# Patient Record
Sex: Male | Born: 1995 | Race: Black or African American | Hispanic: No | Marital: Single | Smoking: Current some day smoker
Health system: Southern US, Community
[De-identification: ages and names within clinical notes are randomized; demographics above are authoritative.]

## PROBLEM LIST (undated history)

## (undated) DIAGNOSIS — Z789 Other specified health status: Secondary | ICD-10-CM

## (undated) HISTORY — PX: OTHER SURGICAL HISTORY: SHX169

---
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
## 2019-06-11 ENCOUNTER — Other Ambulatory Visit: Payer: Self-pay

## 2019-06-11 ENCOUNTER — Ambulatory Visit (HOSPITAL_COMMUNITY)
Admission: EM | Admit: 2019-06-11 | Discharge: 2019-06-11 | Disposition: A | Discharge status: Home / Self Care | Payer: Self-pay | Attending: Internal Medicine | Admitting: Internal Medicine

## 2019-06-11 ENCOUNTER — Other Ambulatory Visit: Payer: Self-pay | Admitting: Internal Medicine

## 2019-06-11 ENCOUNTER — Encounter (HOSPITAL_COMMUNITY): Payer: Self-pay

## 2019-06-11 DIAGNOSIS — Z202 Contact with and (suspected) exposure to infections with a predominantly sexual mode of transmission: Secondary | ICD-10-CM

## 2019-06-11 HISTORY — DX: Other specified health status: Z78.9

## 2019-06-11 MED ORDER — CEFTRIAXONE SODIUM 500 MG IJ SOLR
INTRAMUSCULAR | Status: AC
  Filled 2019-06-11: qty 500

## 2019-06-11 MED ORDER — CEFTRIAXONE SODIUM 500 MG IJ SOLR
500.0000 mg | Freq: Once | INTRAMUSCULAR | Status: AC
  Administered 2019-06-11: 500 mg via INTRAMUSCULAR

## 2019-06-11 NOTE — ED Provider Notes (Signed)
[facility]    CSN: [id number] Arrival date & time: 06/11/19  0900      History   Chief Complaint Chief Complaint  Patient presents with  . SEXUALLY TRANSMITTED DISEASE    HPI Justin Blair is a 24 y.o. male comes to urgent care for STD testing and treatment for gonorrhea.  Patient's sexual partner was found to be positive for gonorrhea.  She is currently pregnant and the diagnosis was made on her routine OB visit.  Patient has no symptoms.  Noted penile discharge, scrotal pain, groin pain or swelling.   HPI  Past Medical History:  Diagnosis Date  . No pertinent past medical history     There are no problems to display for this patient.   Past Surgical History:  Procedure Laterality Date  . no pertinent past surgical history         Home Medications    Prior to Admission medications   Not on File    Family History No family history on file.  Social History Social History   Tobacco Use  . Smoking status: Current Some Day Smoker    Types: Cigars  . Smokeless tobacco: Never Used  . Tobacco comment: 2-3 black and mild 3-4 days per week  Substance Use Topics  . Alcohol use: Not Currently  . Drug use: Yes    Types: Marijuana    Comment: daily     Allergies   Patient has no known allergies.   Review of Systems Review of Systems  Genitourinary: Negative for discharge, dysuria, flank pain, frequency, genital sores, hematuria, penile pain, penile swelling, scrotal swelling and testicular pain.  Neurological: Negative for dizziness, light-headedness and headaches.     Physical Exam Triage Vital Signs ED Triage Vitals  Enc Vitals Group     BP 06/11/19 0918 (!) 145/85     Pulse Rate 06/11/19 0918 73     Resp 06/11/19 0918 16     Temp 06/11/19 0918 98.1 F (36.7 C)     Temp Source 06/11/19 0918 Oral     SpO2 06/11/19 0918 100 %     Weight 06/11/19 0914 121 lb (54.9 kg)     Height --      Head Circumference --      Peak Flow --     Pain Score 06/11/19 0914 0     Pain Loc --      Pain Edu? --      Excl. in GC? --    No data found.  Updated Vital Signs BP (!) 145/85 (BP Location: Left Arm)   Pulse 73   Temp 98.1 F (36.7 C) (Oral)   Resp 16   Wt 54.9 kg   SpO2 100%   Visual Acuity Right Eye Distance:   Left Eye Distance:   Bilateral Distance:    Right Eye Near:   Left Eye Near:    Bilateral Near:     Physical Exam Constitutional:      General: He is not in acute distress.    Appearance: He is not ill-appearing.  Cardiovascular:     Rate and Rhythm: Normal rate and regular rhythm.     Pulses: Normal pulses.     Heart sounds: Normal heart sounds.  Abdominal:     General: Bowel sounds are normal. There is no distension.     Tenderness: There is no abdominal tenderness. There is no rebound.  Genitourinary:    Penis: Normal.  Testes: Normal.  Musculoskeletal:        General: No swelling or tenderness. Normal range of motion.  Neurological:     General: No focal deficit present.     Mental Status: He is alert and oriented to person, place, and time.      UC Treatments / Results  Labs (all labs ordered are listed, but only abnormal results are displayed) Labs Reviewed  CYTOLOGY, (ORAL, ANAL, URETHRAL) ANCILLARY ONLY    EKG   Radiology No results found.  Procedures Procedures (including critical care time)  Medications Ordered in UC Medications  cefTRIAXone (ROCEPHIN) injection 500 mg (500 mg Intramuscular Given 06/11/19 0953)    Initial Impression / Assessment and Plan / UC Course  I have reviewed the triage vital signs and the nursing notes.  Pertinent labs & imaging results that were available during my care of the patient were reviewed by me and considered in my medical decision making (see chart for details).     1.  Exposure to gonorrhea: Cytology for GC/chlamydia/trichomonas Ceftriaxone 500 mg IM x1 dose Return precautions given If STD screen is positive,  treatment plan will be updated to reflect the test results. Final Clinical Impressions(s) / UC Diagnoses   Final diagnoses:  Exposure to STD   Discharge Instructions   None    ED Prescriptions    None     PDMP not reviewed this encounter.   Chase Picket, MD 06/11/19 [id number]

## 2019-06-11 NOTE — ED Triage Notes (Signed)
Pt reports regular sexual partner tested positive for gonorrhea yesterday. Pt denies any symptoms but wants to get checked just incase.

## 2019-06-12 LAB — CYTOLOGY, (ORAL, ANAL, URETHRAL) ANCILLARY ONLY
Chlamydia: NEGATIVE
Neisseria Gonorrhea: NEGATIVE
Trichomonas: NEGATIVE

## 2019-10-10 ENCOUNTER — Ambulatory Visit (HOSPITAL_COMMUNITY)
Admission: RE | Admit: 2019-10-10 | Discharge: 2019-10-10 | Disposition: A | Discharge status: Home / Self Care | Payer: Medicaid Other | Source: Ambulatory Visit | Attending: Family Medicine | Admitting: Family Medicine

## 2019-10-10 ENCOUNTER — Encounter (HOSPITAL_COMMUNITY): Payer: Self-pay

## 2019-10-10 ENCOUNTER — Other Ambulatory Visit: Payer: Self-pay

## 2019-10-10 VITALS — BP 115/70 | HR 83 | Temp 97.9°F | Resp 12

## 2019-10-10 DIAGNOSIS — U071 COVID-19: Secondary | ICD-10-CM | POA: Insufficient documentation

## 2019-10-10 DIAGNOSIS — Z20822 Contact with and (suspected) exposure to covid-19: Secondary | ICD-10-CM

## 2019-10-10 LAB — SARS CORONAVIRUS 2 (TAT 6-24 HRS): SARS Coronavirus 2: POSITIVE — AB

## 2019-10-10 NOTE — ED Provider Notes (Signed)
[facility]    CSN: [id number] Arrival date & time: 10/10/19  1242      History   Chief Complaint Chief Complaint  Patient presents with  . Covid Exposure    HPI Justin Blair is a 24 y.o. male presenting today for Covid testing.  Patient reports that multiple household members recently tested positive for Covid but she is in close contact with.  He does report over the past 1 to 2 days he did have a slight backache as well as decreased smell, but the symptoms have resolved.  Currently reports being asymptomatic.  HPI  Past Medical History:  Diagnosis Date  . No pertinent past medical history     There are no problems to display for this patient.   Past Surgical History:  Procedure Laterality Date  . no pertinent past surgical history         Home Medications    Prior to Admission medications   Not on File    Family History History reviewed. No pertinent family history.  Social History Social History   Tobacco Use  . Smoking status: Current Some Day Smoker    Types: Cigars  . Smokeless tobacco: Never Used  . Tobacco comment: 2-3 black and mild 3-4 days per week  Vaping Use  . Vaping Use: Never used  Substance Use Topics  . Alcohol use: Not Currently  . Drug use: Yes    Types: Marijuana    Comment: daily     Allergies   Patient has no known allergies.   Review of Systems Review of Systems  Constitutional: Negative for activity change, appetite change, chills, fatigue and fever.  HENT: Negative for congestion, ear pain, rhinorrhea, sinus pressure, sore throat and trouble swallowing.   Eyes: Negative for discharge and redness.  Respiratory: Negative for cough, chest tightness and shortness of breath.   Cardiovascular: Negative for chest pain.  Gastrointestinal: Negative for abdominal pain, diarrhea, nausea and vomiting.  Musculoskeletal: Negative for back pain and myalgias.  Skin: Negative for rash.  Neurological: Negative for  dizziness, light-headedness and headaches.     Physical Exam Triage Vital Signs ED Triage Vitals  Enc Vitals Group     BP      Pulse      Resp      Temp      Temp src      SpO2      Weight      Height      Head Circumference      Peak Flow      Pain Score      Pain Loc      Pain Edu?      Excl. in GC?    No data found.  Updated Vital Signs BP 115/70   Pulse 83   Temp 97.9 F (36.6 C)   Resp 12   SpO2 99%   Visual Acuity Right Eye Distance:   Left Eye Distance:   Bilateral Distance:    Right Eye Near:   Left Eye Near:    Bilateral Near:     Physical Exam Vitals and nursing note reviewed.  Constitutional:      Appearance: He is well-developed.     Comments: No acute distress  HENT:     Head: Normocephalic and atraumatic.     Nose: Nose normal.     Mouth/Throat:     Comments: Oral mucosa pink and moist, no tonsillar enlargement or exudate. Posterior pharynx patent  and nonerythematous, no uvula deviation or swelling. Normal phonation. Eyes:     Conjunctiva/sclera: Conjunctivae normal.  Cardiovascular:     Rate and Rhythm: Normal rate.  Pulmonary:     Effort: Pulmonary effort is normal. No respiratory distress.     Comments: Breathing comfortably at rest, CTABL, no wheezing, rales or other adventitious sounds auscultated Abdominal:     General: There is no distension.  Musculoskeletal:        General: Normal range of motion.     Cervical back: Neck supple.  Skin:    General: Skin is warm and dry.  Neurological:     Mental Status: He is alert and oriented to person, place, and time.      UC Treatments / Results  Labs (all labs ordered are listed, but only abnormal results are displayed) Labs Reviewed  SARS CORONAVIRUS 2 (TAT 6-24 HRS)    EKG   Radiology No results found.  Procedures Procedures (including critical care time)  Medications Ordered in UC Medications - No data to display  Initial Impression / Assessment and Plan / UC  Course  I have reviewed the triage vital signs and the nursing notes.  Pertinent labs & imaging results that were available during my care of the patient were reviewed by me and considered in my medical decision making (see chart for details).     Covid test pending, monitor my chart for results.  Currently asymptomatic.  Does have close contacts, discussed quarantining recommendations if positive.  Discussed strict return precautions. Patient verbalized understanding and is agreeable with plan.  Final Clinical Impressions(s) / UC Diagnoses   Final diagnoses:  Exposure to COVID-19 virus  Encounter for laboratory testing for COVID-19 virus     Discharge Instructions     Monitor MyChart for results    ED Prescriptions    None     PDMP not reviewed this encounter.   Lew Dawes, [place] 10/10/19 1321

## 2019-10-10 NOTE — ED Triage Notes (Signed)
Patient reports his partner and multiple roommates tested positive for COVID. Requesting COVID testing. Denies symptoms.

## 2019-10-10 NOTE — Discharge Instructions (Signed)
Monitor MyChart for results °
